# Patient Record
Sex: Female | Born: 1950 | Race: White | Hispanic: No | Marital: Married | Smoking: Current every day smoker
Health system: Southern US, Community
[De-identification: ages and names within clinical notes are randomized; demographics above are authoritative.]

## PROBLEM LIST (undated history)

## (undated) DIAGNOSIS — Z720 Tobacco use: Secondary | ICD-10-CM

## (undated) DIAGNOSIS — I998 Other disorder of circulatory system: Secondary | ICD-10-CM

## (undated) DIAGNOSIS — I739 Peripheral vascular disease, unspecified: Secondary | ICD-10-CM

## (undated) DIAGNOSIS — I70229 Atherosclerosis of native arteries of extremities with rest pain, unspecified extremity: Secondary | ICD-10-CM

## (undated) HISTORY — DX: Other disorder of circulatory system: I99.8

## (undated) HISTORY — DX: Tobacco use: Z72.0

## (undated) HISTORY — DX: Atherosclerosis of native arteries of extremities with rest pain, unspecified extremity: I70.229

---

## 2001-06-16 ENCOUNTER — Other Ambulatory Visit: Admission: RE | Admit: 2001-06-16 | Discharge: 2001-06-16 | Payer: Self-pay | Admitting: Obstetrics and Gynecology

## 2002-11-08 ENCOUNTER — Ambulatory Visit (HOSPITAL_COMMUNITY): Admission: RE | Admit: 2002-11-08 | Discharge: 2002-11-08 | Payer: Self-pay | Admitting: Obstetrics and Gynecology

## 2002-11-08 ENCOUNTER — Encounter: Payer: Self-pay | Admitting: Obstetrics and Gynecology

## 2003-10-14 ENCOUNTER — Ambulatory Visit (HOSPITAL_COMMUNITY): Admission: RE | Admit: 2003-10-14 | Discharge: 2003-10-14 | Payer: Self-pay | Admitting: Family Medicine

## 2014-09-23 ENCOUNTER — Encounter (HOSPITAL_COMMUNITY): Payer: Self-pay | Admitting: *Deleted

## 2014-09-23 ENCOUNTER — Emergency Department (HOSPITAL_COMMUNITY): Payer: Managed Care, Other (non HMO)

## 2014-09-23 ENCOUNTER — Emergency Department (HOSPITAL_COMMUNITY)
Admission: EM | Admit: 2014-09-23 | Discharge: 2014-09-23 | Disposition: A | Discharge status: Home / Self Care | Payer: Managed Care, Other (non HMO) | Attending: Emergency Medicine | Admitting: Emergency Medicine

## 2014-09-23 DIAGNOSIS — L089 Local infection of the skin and subcutaneous tissue, unspecified: Secondary | ICD-10-CM | POA: Diagnosis not present

## 2014-09-23 DIAGNOSIS — M79674 Pain in right toe(s): Secondary | ICD-10-CM | POA: Diagnosis present

## 2014-09-23 DIAGNOSIS — Z791 Long term (current) use of non-steroidal anti-inflammatories (NSAID): Secondary | ICD-10-CM | POA: Diagnosis not present

## 2014-09-23 DIAGNOSIS — L600 Ingrowing nail: Secondary | ICD-10-CM | POA: Diagnosis not present

## 2014-09-23 DIAGNOSIS — Z720 Tobacco use: Secondary | ICD-10-CM | POA: Diagnosis not present

## 2014-09-23 DIAGNOSIS — R609 Edema, unspecified: Secondary | ICD-10-CM

## 2014-09-23 DIAGNOSIS — Z792 Long term (current) use of antibiotics: Secondary | ICD-10-CM | POA: Insufficient documentation

## 2014-09-23 LAB — COMPREHENSIVE METABOLIC PANEL
ALT: 21 U/L (ref 0–35)
AST: 24 U/L (ref 0–37)
Albumin: 4 g/dL (ref 3.5–5.2)
Alkaline Phosphatase: 101 U/L (ref 39–117)
Anion gap: 7 (ref 5–15)
BILIRUBIN TOTAL: 0.4 mg/dL (ref 0.3–1.2)
BUN: 15 mg/dL (ref 6–23)
CHLORIDE: 106 meq/L (ref 96–112)
CO2: 27 mmol/L (ref 19–32)
CREATININE: 1.02 mg/dL (ref 0.50–1.10)
Calcium: 9.4 mg/dL (ref 8.4–10.5)
GFR calc Af Amer: 67 mL/min — ABNORMAL LOW (ref >90)
GFR calc non Af Amer: 58 mL/min — ABNORMAL LOW (ref >90)
Glucose, Bld: 120 mg/dL — ABNORMAL HIGH (ref 70–99)
Potassium: 4.1 mmol/L (ref 3.5–5.1)
Sodium: 140 mmol/L (ref 135–145)
Total Protein: 7.3 g/dL (ref 6.0–8.3)

## 2014-09-23 LAB — CBC WITH DIFFERENTIAL/PLATELET
BASOS ABS: 0.1 10*3/uL (ref 0.0–0.1)
Basophils Relative: 1 % (ref 0–1)
Eosinophils Absolute: 0.3 10*3/uL (ref 0.0–0.7)
Eosinophils Relative: 4 % (ref 0–5)
HEMATOCRIT: 40.3 % (ref 36.0–46.0)
Hemoglobin: 13.3 g/dL (ref 12.0–15.0)
LYMPHS ABS: 1.3 10*3/uL (ref 0.7–4.0)
LYMPHS PCT: 18 % (ref 12–46)
MCH: 32 pg (ref 26.0–34.0)
MCHC: 33 g/dL (ref 30.0–36.0)
MCV: 97.1 fL (ref 78.0–100.0)
MONO ABS: 0.4 10*3/uL (ref 0.1–1.0)
Monocytes Relative: 6 % (ref 3–12)
NEUTROS ABS: 5.1 10*3/uL (ref 1.7–7.7)
Neutrophils Relative %: 71 % (ref 43–77)
Platelets: 284 10*3/uL (ref 150–400)
RBC: 4.15 MIL/uL (ref 3.87–5.11)
RDW: 13.1 % (ref 11.5–15.5)
WBC: 7.2 10*3/uL (ref 4.0–10.5)

## 2014-09-23 MED ORDER — OXYCODONE-ACETAMINOPHEN 5-325 MG PO TABS: 1.0000 | ORAL_TABLET | Freq: Four times a day (QID) | ORAL | Status: DC | PRN

## 2014-09-23 MED ORDER — CLINDAMYCIN HCL 150 MG PO CAPS: ORAL_CAPSULE | ORAL | Status: DC

## 2014-09-23 MED ORDER — CLINDAMYCIN HCL 150 MG PO CAPS
300.0000 mg | ORAL_CAPSULE | Freq: Once | ORAL | Status: AC
  Administered 2014-09-23: 300 mg via ORAL
  Filled 2014-09-23: qty 2

## 2014-09-23 MED ORDER — OXYCODONE-ACETAMINOPHEN 5-325 MG PO TABS
1.0000 | ORAL_TABLET | Freq: Once | ORAL | Status: AC
  Administered 2014-09-23: 1 via ORAL
  Filled 2014-09-23: qty 1

## 2014-09-23 NOTE — ED Notes (Signed)
Pt had some skin she pulled off about a week and half ago to right great toe, now toe is red with black area to corner of nail

## 2014-09-23 NOTE — ED Provider Notes (Signed)
CSN: [id number]     Arrival date & time 09/23/14  1456 History   First MD Initiated Contact with Patient 09/23/14 1530     Chief Complaint  Patient presents with  . Toe Pain     (Consider location/radiation/quality/duration/timing/severity/associated sxs/prior Treatment) Patient is a 63 y.o. female presenting with toe pain. The history is provided by the patient (the pt states she has had toe pain for weeks.   she is on antibiotics and redness is improving but she still has pain).  Toe Pain This is a new problem. The current episode started more than 2 days ago. The problem occurs constantly. The problem has not changed since onset.Pertinent negatives include no chest pain, no abdominal pain and no headaches. Nothing aggravates the symptoms. Nothing relieves the symptoms.    History reviewed. No pertinent past medical history. History reviewed. No pertinent past surgical history. History reviewed. No pertinent family history. History  Substance Use Topics  . Smoking status: Current Every Day Smoker    Types: Cigarettes  . Smokeless tobacco: Not on file  . Alcohol Use: No   OB History    No data available     Review of Systems  Constitutional: Negative for appetite change and fatigue.  HENT: Negative for congestion, ear discharge and sinus pressure.   Eyes: Negative for discharge.  Respiratory: Negative for cough.   Cardiovascular: Negative for chest pain.  Gastrointestinal: Negative for abdominal pain and diarrhea.  Genitourinary: Negative for frequency and hematuria.  Musculoskeletal: Negative for back pain.       Toe pain  Skin: Negative for rash.  Neurological: Negative for seizures and headaches.  Psychiatric/Behavioral: Negative for hallucinations.      Allergies  Review of patient's allergies indicates no known allergies.  Home Medications   Prior to Admission medications   Medication Sig Start Date End Date Taking? Authorizing Provider  acetaminophen  (TYLENOL) 500 MG tablet Take 1,000 mg by mouth 2 (two) times daily as needed for moderate pain.   Yes Historical Provider, MD  nabumetone (RELAFEN) 750 MG tablet Take 750 mg by mouth 2 (two) times daily.   Yes Historical Provider, MD  sulfamethoxazole-trimethoprim (BACTRIM DS,SEPTRA DS) 800-160 MG per tablet Take 1 tablet by mouth 2 (two) times daily. Starting 09/19/2014 x 10 days.   Yes Historical Provider, MD  clindamycin (CLEOCIN) 150 MG capsule Take 2 tablets every 6 hours 09/23/14   Benny LennertJoseph L Genny Caulder, MD  oxyCODONE-acetaminophen (PERCOCET/ROXICET) 5-325 MG per tablet Take 1 tablet by mouth every 6 (six) hours as needed. 09/23/14   Benny LennertJoseph L Ava Deguire, MD   BP 137/61 mmHg  Pulse 88  Temp(Src) 97.6 F (36.4 C) (Oral)  Resp 16  SpO2 96% Physical Exam  Constitutional: She is oriented to person, place, and time. She appears well-developed.  HENT:  Head: Normocephalic.  Eyes: Conjunctivae are normal.  Neck: No tracheal deviation present.  Cardiovascular:  No murmur heard. Musculoskeletal: Normal range of motion.  Pt has an ingrown nail on the rt medial large toe.  She has a black necrotic area .5 cm diameter.  The toe is red and tender  Neurological: She is oriented to person, place, and time.  Skin: Skin is warm.  Psychiatric: She has a normal mood and affect.    ED Course  Procedures (including critical care time) Labs Review Labs Reviewed  COMPREHENSIVE METABOLIC PANEL - Abnormal; Notable for the following:    Glucose, Bld 120 (*)    GFR calc non Af Amer 58 (*)  GFR calc Af Amer 67 (*)    All other components within normal limits  CBC WITH DIFFERENTIAL    Imaging Review Dg Toe Great Right  09/23/2014   CLINICAL DATA:  RIGHT great toe pain, swollen, blackened area at medial aspect of nail bed  EXAM:  RIGHT GREAT TOE  COMPARISON:  None  FINDINGS: Osseous mineralization normal.  Joint spaces preserved.  No fracture, dislocation, or bone destruction.  IMPRESSION: Normal exam.    Electronically Signed   By: Ulyses SouthwardMark  Boles M.D.   On: 09/23/2014 15:49     EKG Interpretation None      MDM   Final diagnoses:  Swelling  Toe infection    Infected toe with small necrosis.   Infection improving with antibiotics.  Neg x-ray.  Will add clinda and have pt follow up with podiatrist in a few days    Benny LennertJoseph L Darrin Apodaca, MD 09/23/14 [phone]

## 2014-09-23 NOTE — Discharge Instructions (Signed)
Follow up with Dr. Emilio Mathavid Tucker  The foot md on Monday or Tuesday.    If you cannot see a foot md this week then follow up with Dr. Franky MachoMark Jenkins surgeon in [place]

## 2014-09-23 NOTE — ED Notes (Signed)
Pt seen at Urgent Care Monday and prescribed antibiotics

## 2014-09-26 ENCOUNTER — Ambulatory Visit (INDEPENDENT_AMBULATORY_CARE_PROVIDER_SITE_OTHER): Payer: Managed Care, Other (non HMO) | Admitting: Podiatry

## 2014-09-26 ENCOUNTER — Encounter: Payer: Self-pay | Admitting: Podiatry

## 2014-09-26 VITALS — BP 107/57 | HR 83 | Temp 98.4°F | Resp 16

## 2014-09-26 DIAGNOSIS — I739 Peripheral vascular disease, unspecified: Secondary | ICD-10-CM

## 2014-09-26 DIAGNOSIS — L03031 Cellulitis of right toe: Secondary | ICD-10-CM

## 2014-09-26 MED ORDER — OXYCODONE-ACETAMINOPHEN 10-325 MG PO TABS: 1.0000 | ORAL_TABLET | Freq: Three times a day (TID) | ORAL | Status: DC | PRN

## 2014-09-26 NOTE — Patient Instructions (Addendum)

## 2014-09-26 NOTE — Progress Notes (Signed)
Subjective:     Patient ID: Adrienne [facility], female   DOB: 12-18-1950, 63 y.o.   MRN: [id number]  HPI patient presents with a three-week history of a lot of pain in the right big toe and toenail medial side that she did try to work on herself. She has been to the emergency room where they feel she has an infection that placed her on antibiotics but it is not helping the extreme pain she is in and also the discoloration of the toe and the coolness of the toe itself   Review of Systems  All other systems reviewed and are negative.      Objective:   Physical Exam  Constitutional: She is oriented to person, place, and time.  Musculoskeletal: Normal range of motion.  Neurological: She is oriented to person, place, and time.  Skin: Skin is dry.  Nursing note and vitals reviewed.  neurovascular status is found to be diminished with diminished PT and DP pulses right over left and cold big toe right with red discoloration to the distal one half of the toe. On the medial side I noted there is some necrotic tissue distally and it is irritated but there was no drainage or odor noted. She is a smoker which is probably contributory to this condition she is experiencing and she is well oriented 3     Assessment:     Right big toe that appears to have some form of vascular embarrassment which may be systemic or local    Plan:     H&P and condition discussed with her and her husband. At this time I did infiltrate 60 mg Xylocaine Marcaine and I gently teased small amount of the tissue on the lateral side to try to reduce the discomfort but I did not take anything of consequence as I do think we are dealing with a vascular issue. I applied Band-Aids to the area with no tight compression and instructed on warm water soaks and I'm sending immediately for vascular evaluation to determine whether or not there is circulatory flow to this toe. I informed her there is a very good chance that she will require  amputation of the big toe right depending on findings and vascular and she is instructed to call us if any worsening of condition should occur. I did place her on Percocet 07/02/24 as the 01/31/24 are not helping with her pain

## 2014-09-26 NOTE — Progress Notes (Signed)
   Subjective:    Patient ID: Adrienne Swanson, female    DOB: Aug 03, 1951, 63 y.o.   MRN: [id number]  HPI Comments: "I have a bad toe"  Patient c/o throbbing 1st toe right, medial border, for about 3 weeks. The area is red, swollen, and extremely dark at the tip of the medial side of toe. She went to the ER and they gave antibiotics and said to soak. Initially, she said she thought was ingrown and trimmed but cut the skin and the nail. She did [facility] and said the bone looked fine.  Toe Pain       Review of Systems  Musculoskeletal: Positive for gait problem.  Hematological: Bruises/bleeds easily.  All other systems reviewed and are negative.      Objective:   Physical Exam        Assessment & Plan:

## 2014-09-27 ENCOUNTER — Ambulatory Visit (HOSPITAL_COMMUNITY)
Admission: RE | Admit: 2014-09-27 | Discharge: 2014-09-27 | Disposition: A | Discharge status: Home / Self Care | Payer: Managed Care, Other (non HMO) | Source: Ambulatory Visit | Attending: Cardiology | Admitting: Cardiology

## 2014-09-27 DIAGNOSIS — I739 Peripheral vascular disease, unspecified: Secondary | ICD-10-CM | POA: Insufficient documentation

## 2014-09-27 DIAGNOSIS — L03031 Cellulitis of right toe: Secondary | ICD-10-CM

## 2014-09-27 NOTE — Progress Notes (Signed)
Right Lower Extremity Arterial Duplex Completed. Evidence for a greater than 60% diameter reduction in the bilateral common iliac arteries. Brianna L Mazza,RVT

## 2014-09-29 ENCOUNTER — Encounter: Payer: Self-pay | Admitting: Podiatry

## 2014-09-29 ENCOUNTER — Telehealth: Payer: Self-pay | Admitting: Cardiovascular Disease

## 2014-09-29 NOTE — Telephone Encounter (Signed)
Closed encounter °

## 2014-09-29 NOTE — Telephone Encounter (Signed)
Nurse called in stating that Dr. Cristie HemNorman Regal is wanting to know the results of the pt's doppler that was done on 12/29. Please call  Thanks

## 2014-09-29 NOTE — Telephone Encounter (Signed)
Called spoke to receptionist-[facility].. RN informed receptionist the final report has been  completed,not scanned into the EPIC. Spoke to [place]ula[place]rianna , RVT-will fax results to [phone]

## 2014-10-03 ENCOUNTER — Telehealth: Payer: Self-pay | Admitting: *Deleted

## 2014-10-03 ENCOUNTER — Telehealth (HOSPITAL_COMMUNITY): Payer: Self-pay | Admitting: *Deleted

## 2014-10-03 NOTE — Telephone Encounter (Signed)
I wanted to call you about my pain meds.  Give me a call back, thank you.

## 2014-10-03 NOTE — Telephone Encounter (Signed)
Pt states she had the [place], but is not able to go back to work due to swelling and inability to get into a shoe.

## 2014-10-04 ENCOUNTER — Encounter: Payer: Self-pay | Admitting: *Deleted

## 2014-10-04 NOTE — Telephone Encounter (Signed)
"  I need you to call Adrienne Swanson.  Thank you."  I returned her call.  "Yes, they never received the note at [facility] for me to be out this week."  Who do I need to send it to?  "Just fax it and there number is [phone].  I'm still having pain in my toe.  It's a sharp pain.  I went to ER on Christmas day and they gave me another pain pill.  Then I came over the last Monday and he prescribed something else.   I was in pain all day yesterday.  I didn't know what to do.  I was scheduled to see [name] on tomorrow but they changed it to Thursday.  Is there anything else I can do?  Is it supposed to hurt like this still even though he has taken the nail out?"  Do you need more pain medicine?  "I still have pain medication.  I don't want to get addicted to it.  I can take it every 6 hours."  Hopefully [name] will be able to help you.  The pain may be due to your circulation problem.  [name] will be able to tell you what is going on and may be able to prescribe something.  "I hope so, I can't take this pain.  Will I see [name] again, do I need to schedule an appointment?"  I advised her no, come as needed.  "Please make sure Adrienne Swanson gets the letter."  I will make sure it is taken care of.  [facility].

## 2014-10-04 NOTE — Telephone Encounter (Signed)
"  I'm calling about my sister.  I'm on her permission to ask question.  She's still in a lot of pain.  I just want to see if there's anything else she can do.  She is in excruciating pain.  We just need some help for her.  Thank you.  I attempted to call the patient.  I left her a message to call me back.

## 2014-10-05 ENCOUNTER — Ambulatory Visit: Payer: Managed Care, Other (non HMO) | Admitting: Cardiovascular Disease

## 2014-10-06 ENCOUNTER — Encounter: Payer: Self-pay | Admitting: Cardiovascular Disease

## 2014-10-06 ENCOUNTER — Ambulatory Visit (INDEPENDENT_AMBULATORY_CARE_PROVIDER_SITE_OTHER): Payer: Managed Care, Other (non HMO) | Admitting: Cardiovascular Disease

## 2014-10-06 VITALS — BP 142/72 | HR 80 | Ht 65.0 in | Wt 142.9 lb

## 2014-10-06 DIAGNOSIS — I70229 Atherosclerosis of native arteries of extremities with rest pain, unspecified extremity: Secondary | ICD-10-CM | POA: Insufficient documentation

## 2014-10-06 DIAGNOSIS — R0989 Other specified symptoms and signs involving the circulatory and respiratory systems: Secondary | ICD-10-CM

## 2014-10-06 DIAGNOSIS — R5383 Other fatigue: Secondary | ICD-10-CM

## 2014-10-06 DIAGNOSIS — I998 Other disorder of circulatory system: Secondary | ICD-10-CM | POA: Insufficient documentation

## 2014-10-06 DIAGNOSIS — Z720 Tobacco use: Secondary | ICD-10-CM

## 2014-10-06 DIAGNOSIS — Z01812 Encounter for preprocedural laboratory examination: Secondary | ICD-10-CM

## 2014-10-06 DIAGNOSIS — I739 Peripheral vascular disease, unspecified: Secondary | ICD-10-CM

## 2014-10-06 DIAGNOSIS — Z79899 Other long term (current) drug therapy: Secondary | ICD-10-CM

## 2014-10-06 NOTE — Patient Instructions (Signed)
[name] has ordered a peripheral angiogram to be done at [facility].  This procedure is going to look at the bloodflow in your lower extremities.  If [name] is able to open up the arteries, you will have to spend one night in the hospital.  If he is not able to open the arteries, you will be able to go home that same day.    After the procedure, you will not be allowed to drive for 3 days or push, pull, or lift anything greater than 10 lbs for one week.    You will be required to have the following tests prior to the procedure:  1. Blood work-the blood work can be done no more than 7 days prior to the procedure.  It can be done at any [facility] lab.  There is one downstairs on the first floor of this building and one in the [facility] ([address])  2. Chest Xray-the chest xray order has already been placed at the [facility].     *REPS N/A

## 2014-10-06 NOTE — Progress Notes (Signed)
10/06/2014 Adrienne Swanson   Feb 01, 1951  [id number]  Primary Physician Colette RibasGOLDING, JOHN CABOT, MD Primary Cardiologist: Runell GessJonathan J. 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 She is accompanied by her husband today. She is referred by Dr. Charlsie Merlesegal , her podiatrist, for peripheral vascular evaluation because of a gangrenous right great toe and abnormal Doppler studies. Her primary care physician is Dr. Phillips OdorGolding in [place]. Her risk factors include 45 pack years of tobacco abuse currently trying to stop. Her father did have bypass surgery in his 4570s. There is no history of diabetes, hypertension or hyperlipidemia. She noted that painful right great toe early December. It became discolored later in the month. She saw her podiatrist who trimmed her toenails and some skin surrounding this. This subsequently turned gangrenous. Lower extremity arterial Doppler studies in our office performed 09/27/14 revealed a right ABI of 0.7 to come out left ABI 0.78 with bilateral iliac disease. She does complain of right hip thigh and calf claudication.   Current Outpatient Prescriptions  Medication Sig Dispense Refill  . acetaminophen (TYLENOL) 500 MG tablet Take 1,000 mg by mouth 2 (two) times daily as needed for moderate pain.    Marland Kitchen. oxyCODONE-acetaminophen (PERCOCET/ROXICET) 5-325 MG per tablet Take 1 tablet by mouth every 6 (six) hours as needed. 30 tablet 0   No current facility-administered medications for this visit.    No Known Allergies  History   Social History  . Marital Status: Married    Spouse Name: N/A    Number of Children: N/A  . Years of Education: N/A   Occupational History  . Not on file.   Social History Main Topics  . Smoking status: Current Every Day Smoker    Types: Cigarettes  . Smokeless tobacco: Not  on file  . Alcohol Use: No  . Drug Use: No  . Sexual Activity: Not on file   Other Topics Concern  . Not on file   Social History Narrative     Review of Systems: General: negative for chills, fever, night sweats or weight changes.  Cardiovascular: negative for chest pain, dyspnea on exertion, edema, orthopnea, palpitations, paroxysmal nocturnal dyspnea or shortness of breath Dermatological: negative for rash Respiratory: negative for cough or wheezing Urologic: negative for hematuria Abdominal: negative for nausea, vomiting, diarrhea, bright red blood per rectum, melena, or hematemesis Neurologic: negative for visual changes, syncope, or dizziness All other systems reviewed and are otherwise negative except as noted above.    Blood pressure 142/72, pulse 80, height 5\' 5"  (1.651 m), weight 142 lb 14.4 oz (64.819 kg).  General appearance: alert and no distress Neck: no adenopathy, no JVD, supple, symmetrical, trachea midline, thyroid not enlarged, symmetric, no tenderness/mass/nodules and soft right carotid bruit Lungs: clear to auscultation bilaterally Heart: regular rate and rhythm, S1, S2 normal, no murmur, click, rub or gallop Extremities: gangrenous appearing tip of her right great toe, palpable right posterior tibial, 1+ common femoral pulses without bruits  EKG not performed today  ASSESSMENT AND PLAN:   Critical lower limb ischemia The patient was referred by Dr. Orlene OchNorm Regal for evaluation of critical limb ischemia.she developed pain in her right great toe in early December. She developed lesion around Christmas. Dr. Charlsie Merlesegal trims them skin on her and since that time the toe has turned up blackish discoloration with severe  pain. She had Dopplers performed 09/27/14 revealed an ABI 0.72 on the right and 0.70 on the left. She did have a high-frequency signal at the origin of both iliac arteries right greater than left. She does complain of right hip thigh and calf claudication  especially when walking up an incline. Based on this tendon to arrange for undergo angiography and potential intervention of her right iliac artery in the setting of a gangrenous right great toe.      Runell Gess MD FACP,FACC,FAHA, [facility] 10/06/2014 2:34 PM

## 2014-10-06 NOTE — Assessment & Plan Note (Signed)
The patient was referred by Dr. Orlene OchNorm Regal for evaluation of critical limb ischemia.she developed pain in her right great toe in early December. She developed lesion around Christmas. Dr. Charlsie Merlesegal trims them skin on her and since that time the toe has turned up blackish discoloration with severe pain. She had Dopplers performed 09/27/14 revealed an ABI 0.72 on the right and 0.70 on the left. She did have a high-frequency signal at the origin of both iliac arteries right greater than left. She does complain of right hip thigh and calf claudication especially when walking up an incline. Based on this tendon to arrange for undergo angiography and potential intervention of her right iliac artery in the setting of a gangrenous right great toe.

## 2014-10-07 ENCOUNTER — Ambulatory Visit (HOSPITAL_COMMUNITY)
Admission: RE | Admit: 2014-10-07 | Discharge: 2014-10-07 | Disposition: A | Discharge status: Home / Self Care | Payer: Managed Care, Other (non HMO) | Source: Ambulatory Visit | Attending: Cardiovascular Disease | Admitting: Cardiovascular Disease

## 2014-10-07 ENCOUNTER — Telehealth: Payer: Self-pay | Admitting: Cardiovascular Disease

## 2014-10-07 DIAGNOSIS — Z01818 Encounter for other preprocedural examination: Secondary | ICD-10-CM | POA: Diagnosis present

## 2014-10-07 DIAGNOSIS — Z01812 Encounter for preprocedural laboratory examination: Secondary | ICD-10-CM

## 2014-10-07 DIAGNOSIS — Z720 Tobacco use: Secondary | ICD-10-CM

## 2014-10-07 LAB — APTT: APTT: 27 s (ref 24–37)

## 2014-10-07 LAB — COMPLETE METABOLIC PANEL WITH GFR
ALBUMIN: 3.9 g/dL (ref 3.5–5.2)
ALT: 115 U/L — ABNORMAL HIGH (ref 0–35)
AST: 41 U/L — AB (ref 0–37)
Alkaline Phosphatase: 168 U/L — ABNORMAL HIGH (ref 39–117)
BUN: 11 mg/dL (ref 6–23)
CALCIUM: 8.8 mg/dL (ref 8.4–10.5)
CO2: 26 mEq/L (ref 19–32)
Chloride: 105 mEq/L (ref 96–112)
Creat: 0.64 mg/dL (ref 0.50–1.10)
GFR, Est African American: >89 mL/min
GFR, Est Non African American: >89 mL/min
Glucose, Bld: 91 mg/dL (ref 70–99)
Potassium: 4.1 mEq/L (ref 3.5–5.3)
SODIUM: 143 meq/L (ref 135–145)
Total Bilirubin: 0.6 mg/dL (ref 0.2–1.2)
Total Protein: 6.9 g/dL (ref 6.0–8.3)

## 2014-10-07 LAB — CBC
HCT: 39.8 % (ref 36.0–46.0)
Hemoglobin: 13.4 g/dL (ref 12.0–15.0)
MCH: 31.1 pg (ref 26.0–34.0)
MCHC: 33.7 g/dL (ref 30.0–36.0)
MCV: 92.3 fL (ref 78.0–100.0)
MPV: 9.1 fL (ref 8.6–12.4)
Platelets: 338 10*3/uL (ref 150–400)
RBC: 4.31 MIL/uL (ref 3.87–5.11)
RDW: 14.2 % (ref 11.5–15.5)
WBC: 6.8 10*3/uL (ref 4.0–10.5)

## 2014-10-07 LAB — PROTIME-INR
INR: 0.99 (ref <1.50)
PROTHROMBIN TIME: 13.2 s (ref 11.6–15.2)

## 2014-10-07 LAB — TSH: TSH: 3.608 u[IU]/mL (ref 0.350–4.500)

## 2014-10-07 LAB — LIPID PANEL
CHOL/HDL RATIO: 6.3 ratio
CHOLESTEROL: 169 mg/dL (ref 0–200)
HDL: 27 mg/dL — ABNORMAL LOW (ref >39)
LDL CALC: 104 mg/dL — AB (ref 0–99)
Triglycerides: 190 mg/dL — ABNORMAL HIGH (ref <150)
VLDL: 38 mg/dL (ref 0–40)

## 2014-10-10 ENCOUNTER — Encounter (HOSPITAL_COMMUNITY)
Admission: RE | Disposition: A | Discharge status: Home / Self Care | Payer: Self-pay | Source: Ambulatory Visit | Attending: Cardiovascular Disease

## 2014-10-10 ENCOUNTER — Encounter (HOSPITAL_COMMUNITY): Payer: Self-pay | Admitting: General Practice

## 2014-10-10 ENCOUNTER — Ambulatory Visit (HOSPITAL_COMMUNITY)
Admission: RE | Admit: 2014-10-10 | Discharge: 2014-10-11 | Disposition: A | Discharge status: Home / Self Care | Payer: Managed Care, Other (non HMO) | Source: Ambulatory Visit | Attending: Cardiovascular Disease | Admitting: Cardiovascular Disease

## 2014-10-10 DIAGNOSIS — I739 Peripheral vascular disease, unspecified: Secondary | ICD-10-CM | POA: Diagnosis present

## 2014-10-10 DIAGNOSIS — Z79899 Other long term (current) drug therapy: Secondary | ICD-10-CM

## 2014-10-10 DIAGNOSIS — I70229 Atherosclerosis of native arteries of extremities with rest pain, unspecified extremity: Secondary | ICD-10-CM | POA: Diagnosis present

## 2014-10-10 DIAGNOSIS — F17210 Nicotine dependence, cigarettes, uncomplicated: Secondary | ICD-10-CM | POA: Insufficient documentation

## 2014-10-10 DIAGNOSIS — E785 Hyperlipidemia, unspecified: Secondary | ICD-10-CM | POA: Diagnosis present

## 2014-10-10 DIAGNOSIS — Z6823 Body mass index (BMI) 23.0-23.9, adult: Secondary | ICD-10-CM | POA: Insufficient documentation

## 2014-10-10 DIAGNOSIS — F17200 Nicotine dependence, unspecified, uncomplicated: Secondary | ICD-10-CM | POA: Diagnosis present

## 2014-10-10 DIAGNOSIS — Z01812 Encounter for preprocedural laboratory examination: Secondary | ICD-10-CM

## 2014-10-10 DIAGNOSIS — E663 Overweight: Secondary | ICD-10-CM | POA: Insufficient documentation

## 2014-10-10 DIAGNOSIS — I998 Other disorder of circulatory system: Secondary | ICD-10-CM | POA: Diagnosis present

## 2014-10-10 DIAGNOSIS — I70213 Atherosclerosis of native arteries of extremities with intermittent claudication, bilateral legs: Secondary | ICD-10-CM

## 2014-10-10 DIAGNOSIS — R0989 Other specified symptoms and signs involving the circulatory and respiratory systems: Secondary | ICD-10-CM

## 2014-10-10 DIAGNOSIS — R5383 Other fatigue: Secondary | ICD-10-CM

## 2014-10-10 DIAGNOSIS — I70263 Atherosclerosis of native arteries of extremities with gangrene, bilateral legs: Secondary | ICD-10-CM | POA: Diagnosis not present

## 2014-10-10 DIAGNOSIS — Z720 Tobacco use: Secondary | ICD-10-CM

## 2014-10-10 HISTORY — PX: LOWER EXTREMITY ANGIOGRAM: SHX5955

## 2014-10-10 HISTORY — DX: Peripheral vascular disease, unspecified: I73.9

## 2014-10-10 HISTORY — PX: LOWER EXTREMITY ANGIOGRAM: SHX5508

## 2014-10-10 LAB — POCT ACTIVATED CLOTTING TIME
ACTIVATED CLOTTING TIME: 208 s
Activated Clotting Time: 202 seconds
Activated Clotting Time: 171 seconds

## 2014-10-10 SURGERY — ANGIOGRAM, LOWER EXTREMITY
Anesthesia: LOCAL

## 2014-10-10 MED ORDER — MORPHINE SULFATE 2 MG/ML IJ SOLN: 2.0000 mg | INTRAMUSCULAR | Status: DC | PRN

## 2014-10-10 MED ORDER — ASPIRIN 81 MG PO CHEW
81.0000 mg | CHEWABLE_TABLET | ORAL | Status: AC
  Administered 2014-10-10: 81 mg via ORAL

## 2014-10-10 MED ORDER — LIDOCAINE HCL (PF) 1 % IJ SOLN
INTRAMUSCULAR | Status: AC
  Filled 2014-10-10: qty 30

## 2014-10-10 MED ORDER — ACETAMINOPHEN 500 MG PO TABS: 1000.0000 mg | ORAL_TABLET | Freq: Two times a day (BID) | ORAL | Status: DC | PRN

## 2014-10-10 MED ORDER — CLOPIDOGREL BISULFATE 75 MG PO TABS
300.0000 mg | ORAL_TABLET | Freq: Every day | ORAL | Status: DC
  Administered 2014-10-10: 300 mg via ORAL

## 2014-10-10 MED ORDER — SODIUM CHLORIDE 0.9 % IV SOLN: INTRAVENOUS | Status: AC

## 2014-10-10 MED ORDER — ACETAMINOPHEN 325 MG PO TABS: 650.0000 mg | ORAL_TABLET | ORAL | Status: DC | PRN

## 2014-10-10 MED ORDER — HEPARIN SODIUM (PORCINE) 1000 UNIT/ML IJ SOLN
INTRAMUSCULAR | Status: AC
  Filled 2014-10-10: qty 1

## 2014-10-10 MED ORDER — CLOPIDOGREL BISULFATE 75 MG PO TABS
75.0000 mg | ORAL_TABLET | Freq: Every day | ORAL | Status: DC
  Administered 2014-10-11: 75 mg via ORAL
  Filled 2014-10-10: qty 1

## 2014-10-10 MED ORDER — MIDAZOLAM HCL 2 MG/2ML IJ SOLN
INTRAMUSCULAR | Status: AC
  Filled 2014-10-10: qty 2

## 2014-10-10 MED ORDER — CLOPIDOGREL BISULFATE 300 MG PO TABS
ORAL_TABLET | ORAL | Status: AC
  Filled 2014-10-10: qty 1

## 2014-10-10 MED ORDER — ONDANSETRON HCL 4 MG/2ML IJ SOLN: 4.0000 mg | Freq: Four times a day (QID) | INTRAMUSCULAR | Status: DC | PRN

## 2014-10-10 MED ORDER — SODIUM CHLORIDE 0.9 % IJ SOLN: 3.0000 mL | INTRAMUSCULAR | Status: DC | PRN

## 2014-10-10 MED ORDER — ASPIRIN 81 MG PO CHEW
CHEWABLE_TABLET | ORAL | Status: AC
  Filled 2014-10-10: qty 4

## 2014-10-10 MED ORDER — SODIUM CHLORIDE 0.9 % IV SOLN
INTRAVENOUS | Status: DC
  Administered 2014-10-10: 08:00:00 via INTRAVENOUS

## 2014-10-10 MED ORDER — ASPIRIN 81 MG PO CHEW
324.0000 mg | CHEWABLE_TABLET | Freq: Once | ORAL | Status: AC
  Administered 2014-10-10: 324 mg via ORAL

## 2014-10-10 MED ORDER — FENTANYL CITRATE 0.05 MG/ML IJ SOLN
INTRAMUSCULAR | Status: AC
  Filled 2014-10-10: qty 2

## 2014-10-10 MED ORDER — HEPARIN (PORCINE) IN NACL 2-0.9 UNIT/ML-% IJ SOLN
INTRAMUSCULAR | Status: AC
  Filled 2014-10-10: qty 1000

## 2014-10-10 MED ORDER — ASPIRIN 81 MG PO CHEW
CHEWABLE_TABLET | ORAL | Status: AC
  Administered 2014-10-10: 81 mg via ORAL
  Filled 2014-10-10: qty 1

## 2014-10-10 MED ORDER — OXYCODONE-ACETAMINOPHEN 5-325 MG PO TABS: 1.0000 | ORAL_TABLET | Freq: Four times a day (QID) | ORAL | Status: DC | PRN

## 2014-10-10 NOTE — CV Procedure (Signed)
Adrienne Swanson is a 64 y.o. female    [id number] LOCATION:  FACILITY: [facility]  PHYSICIAN: Nanetta Batty, M.D. 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 She is accompanied by her husband today. She is referred by Dr. Charlsie Merles , her podiatrist, for peripheral vascular evaluation because of a gangrenous right great toe and abnormal Doppler studies. Her primary care physician is Dr. Phillips Odor in [place]. Her risk factors include 45 pack years of tobacco abuse currently trying to stop. Her father did have bypass surgery in his 90s. There is no history of diabetes, hypertension or hyperlipidemia. She noted that painful right great toe early December. It became discolored later in the month. She saw her podiatrist who trimmed her toenails and some skin surrounding this. This subsequently turned gangrenous. Lower extremity arterial Doppler studies in our office performed 09/27/14 revealed a right ABI of 0.7 to come out left ABI 0.78 with bilateral iliac disease. She does complain of right hip thigh and calf claudication.   PROCEDURE DESCRIPTION:   The patient was brought to the second floor [facility] lab in the postabsorptive state. She was premedicated with Valium 5 mg by mouth, IV Versed and fentanyl. Her right and left groinsWere prepped and shaved in usual sterile fashion. Xylocaine 1% was used for local anesthesia. A 5 French sheath was inserted into the right common femoral artery using standard Seldinger technique. 5 French pigtail catheter was placed in the distal abdominal aorta. Distal abdominal aorta aortography, bilateral iliac angiography with bifemoral runoff was performed using bolus chase digital subtraction  step table technique. Visipaque dye was used for the entirety of the case. Retrograde aortic pressure was monitored during the case.   HEMODYNAMICS:    AO SYSTOLIC/AO DIASTOLIC: 147/58, there was a 40 mm transstenotic gradient across the right common iliac artery ostium   Angiographic Data:   1: Abdominal aortogram-distal abdominal aorta was free of significant disease  2: Left lower extremity-there was a 60-70% ostial left common iliac artery stenosis. The left SFA was occluded at its origin and reconstituted by profunda femoris collaterals and Hunter's canal. There was three-vessel runoff  3: Right lower extremity-there was a 90% stenosis at the origin of the right common iliac artery with a 40 mm transstenotic gradient. 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 She also has right hip and lower extremity claudication. We will proceed with PTA and stenting of her iliac arteries using "kissing stent technique and  ICast covered stents.  Procedure Description:the 5 French sheath was exchanged over a 0.35 Versicore wire for a 7 French bright tip sheath. A 7 French bright tip sheath was inserted into the left common femoral artery using standard Seldinger technique. The patient received 7500 units of heparin intravenously with ending ACT of 202. Atenolol 170 mL of contrast was administered the patient. I placed 2 Versicores  across the right and left common iliac arteries. I then predilated the right common iliac artery ostium with a 5  mm x 4 centimeter balloon at nominal pressures. Following this I placed an 8 mm x 38 mm ICast  at the origin of the left common iliac and a 7 mm x 38 mm ICast  at the origin of the right common iliac  arteries. I then inflated simultaneously and postdilated with an 8mm x 4 cm of on the right and an 8mm x 2 cm balloon on the left resulting in reduction of a 90% right and 70% left ostial common iliac artery stenoses to 0% residual. The patient how the procedure well. I was able to switch out the right tip 7 French sheath for a River sheath on the right however I was unable to do this on the left and therefore had to hold manual pressure in the cath lab. The patient left the lab in stable condition. She'll be treated with dual antiplatelet therapy.  Final Impression: successful bilateral iliac artery PTA and stenting using ICast  Covered  stents for critical limb ischemia. The sheath on the right side will be removed once ACT falls below 170 and pressure held. She'll be gently hydrated overnight, discharged home in the morning on dual antiplatelet therapy. She'll need careful follow-up of her gangrenous right great toe. I will communicate this with her podiatrist Dr. Charlsie Merlesegal . We will get lower extremity arterial Doppler studies in our [place] office next week and will she will see mid-level provider back in 2-3 weeks on the date of I am in the office.    Runell GessBERRY,Alfard Cochrane J. MD, [facility] 10/10/2014 11:56 AM

## 2014-10-10 NOTE — Progress Notes (Signed)
Left groin site soft w/bruising. No hematoma. Tegaderm dressing applied.

## 2014-10-10 NOTE — H&P (View-Only) (Signed)
   10/06/2014 Adrienne Swanson   07/09/1951  [id number]  Primary Physician GOLDING, JOHN CABOT, MD Primary Cardiologist: Essance Gatti J. 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 She is accompanied by her husband today. She is referred by Dr. Regal , her podiatrist, for peripheral vascular evaluation because of a gangrenous right great toe and abnormal Doppler studies. Her primary care physician is Dr. Golding in [place]. Her risk factors include 45 pack years of tobacco abuse currently trying to stop. Her father did have bypass surgery in his 70s. There is no history of diabetes, hypertension or hyperlipidemia. She noted that painful right great toe early December. It became discolored later in the month. She saw her podiatrist who trimmed her toenails and some skin surrounding this. This subsequently turned gangrenous. Lower extremity arterial Doppler studies in our office performed 09/27/14 revealed a right ABI of 0.7 to come out left ABI 0.78 with bilateral iliac disease. She does complain of right hip thigh and calf claudication.   Current Outpatient Prescriptions  Medication Sig Dispense Refill  . acetaminophen (TYLENOL) 500 MG tablet Take 1,000 mg by mouth 2 (two) times daily as needed for moderate pain.    . oxyCODONE-acetaminophen (PERCOCET/ROXICET) 5-325 MG per tablet Take 1 tablet by mouth every 6 (six) hours as needed. 30 tablet 0   No current facility-administered medications for this visit.    No Known Allergies  History   Social History  . Marital Status: Married    Spouse Name: N/A    Number of Children: N/A  . Years of Education: N/A   Occupational History  . Not on file.   Social History Main Topics  . Smoking status: Current Every Day Smoker    Types: Cigarettes  . Smokeless tobacco: Not  on file  . Alcohol Use: No  . Drug Use: No  . Sexual Activity: Not on file   Other Topics Concern  . Not on file   Social History Narrative     Review of Systems: General: negative for chills, fever, night sweats or weight changes.  Cardiovascular: negative for chest pain, dyspnea on exertion, edema, orthopnea, palpitations, paroxysmal nocturnal dyspnea or shortness of breath Dermatological: negative for rash Respiratory: negative for cough or wheezing Urologic: negative for hematuria Abdominal: negative for nausea, vomiting, diarrhea, bright red blood per rectum, melena, or hematemesis Neurologic: negative for visual changes, syncope, or dizziness All other systems reviewed and are otherwise negative except as noted above.    Blood pressure 142/72, pulse 80, height 5' 5" (1.651 m), weight 142 lb 14.4 oz (64.819 kg).  General appearance: alert and no distress Neck: no adenopathy, no JVD, supple, symmetrical, trachea midline, thyroid not enlarged, symmetric, no tenderness/mass/nodules and soft right carotid bruit Lungs: clear to auscultation bilaterally Heart: regular rate and rhythm, S1, S2 normal, no murmur, click, rub or gallop Extremities: gangrenous appearing tip of her right great toe, palpable right posterior tibial, 1+ common femoral pulses without bruits  EKG not performed today  ASSESSMENT AND PLAN:   Critical lower limb ischemia The patient was referred by Dr. Norm Regal for evaluation of critical limb ischemia.she developed pain in her right great toe in early December. She developed lesion around Christmas. Dr. Regal trims them skin on her and since that time the toe has turned up blackish discoloration with severe   pain. She had Dopplers performed 09/27/14 revealed an ABI 0.72 on the right and 0.70 on the left. She did have a high-frequency signal at the origin of both iliac arteries right greater than left. She does complain of right hip thigh and calf claudication  especially when walking up an incline. Based on this tendon to arrange for undergo angiography and potential intervention of her right iliac artery in the setting of a gangrenous right great toe.      Odalis Jordan J. Ozella Comins MD FACP,FACC,FAHA, FSCAI 10/06/2014 2:34 PM  

## 2014-10-10 NOTE — Telephone Encounter (Signed)
Closed encounter °

## 2014-10-10 NOTE — Interval H&P Note (Signed)
History and Physical Interval Note:  10/10/2014 10:30 AM  Adrienne Swanson  has presented today for surgery, with the diagnosis of pvd  The various methods of treatment have been discussed with the patient and family. After consideration of risks, benefits and other options for treatment, the patient has consented to  Procedure(s): LOWER EXTREMITY ANGIOGRAM (N/A) as a surgical intervention .  The patient's history has been reviewed, patient examined, no change in status, stable for surgery.  I have reviewed the patient's chart and labs.  Questions were answered to the patient's satisfaction.     Runell GessBERRY,Aurore Redinger J

## 2014-10-10 NOTE — Progress Notes (Addendum)
Site area: rt groin Site Prior to Removal:  Level o Pressure Applied For 45 min Manual:   yes Patient Status During Pull:  sstable Post Pull Site:  Level 0 Post Pull Instructions Given:  Post instructions given to patient Post Pull Pulses Present: palpable bil dp Dressing Applied:  tergderm applied to rt groin Bedrest begins @ 1320 Comments: rt groin popped additional pressure held; total of 45 min hold. Site level 0

## 2014-10-11 ENCOUNTER — Telehealth: Payer: Self-pay | Admitting: Cardiovascular Disease

## 2014-10-11 ENCOUNTER — Telehealth (HOSPITAL_COMMUNITY): Payer: Self-pay | Admitting: *Deleted

## 2014-10-11 ENCOUNTER — Other Ambulatory Visit: Payer: Self-pay | Admitting: Cardiology

## 2014-10-11 DIAGNOSIS — I998 Other disorder of circulatory system: Secondary | ICD-10-CM

## 2014-10-11 DIAGNOSIS — Z720 Tobacco use: Secondary | ICD-10-CM

## 2014-10-11 DIAGNOSIS — I70229 Atherosclerosis of native arteries of extremities with rest pain, unspecified extremity: Secondary | ICD-10-CM

## 2014-10-11 DIAGNOSIS — E785 Hyperlipidemia, unspecified: Secondary | ICD-10-CM | POA: Diagnosis present

## 2014-10-11 DIAGNOSIS — I739 Peripheral vascular disease, unspecified: Secondary | ICD-10-CM | POA: Diagnosis present

## 2014-10-11 DIAGNOSIS — I70263 Atherosclerosis of native arteries of extremities with gangrene, bilateral legs: Secondary | ICD-10-CM | POA: Diagnosis not present

## 2014-10-11 DIAGNOSIS — F17200 Nicotine dependence, unspecified, uncomplicated: Secondary | ICD-10-CM | POA: Diagnosis present

## 2014-10-11 LAB — BASIC METABOLIC PANEL
Anion gap: 3 — ABNORMAL LOW (ref 5–15)
BUN: 13 mg/dL (ref 6–23)
CALCIUM: 8 mg/dL — AB (ref 8.4–10.5)
CO2: 23 mmol/L (ref 19–32)
CREATININE: 0.61 mg/dL (ref 0.50–1.10)
Chloride: 112 mEq/L (ref 96–112)
GFR calc Af Amer: >90 mL/min (ref >90)
GLUCOSE: 102 mg/dL — AB (ref 70–99)
Potassium: 3.9 mmol/L (ref 3.5–5.1)
SODIUM: 138 mmol/L (ref 135–145)

## 2014-10-11 LAB — CBC
HCT: 31.6 % — ABNORMAL LOW (ref 36.0–46.0)
Hemoglobin: 10.3 g/dL — ABNORMAL LOW (ref 12.0–15.0)
MCH: 31.2 pg (ref 26.0–34.0)
MCHC: 32.6 g/dL (ref 30.0–36.0)
MCV: 95.8 fL (ref 78.0–100.0)
Platelets: 320 10*3/uL (ref 150–400)
RBC: 3.3 MIL/uL — AB (ref 3.87–5.11)
RDW: 14.2 % (ref 11.5–15.5)
WBC: 7.6 10*3/uL (ref 4.0–10.5)

## 2014-10-11 MED ORDER — ASPIRIN 81 MG PO CHEW
81.0000 mg | CHEWABLE_TABLET | Freq: Every day | ORAL | Status: DC
  Administered 2014-10-11: 81 mg via ORAL
  Filled 2014-10-11: qty 1

## 2014-10-11 MED ORDER — ASPIRIN 81 MG PO CHEW: 81.0000 mg | CHEWABLE_TABLET | Freq: Every day | ORAL | Status: DC

## 2014-10-11 MED ORDER — ATORVASTATIN CALCIUM 40 MG PO TABS: 40.0000 mg | ORAL_TABLET | Freq: Every day | ORAL | Status: DC

## 2014-10-11 MED ORDER — ATORVASTATIN CALCIUM 40 MG PO TABS
40.0000 mg | ORAL_TABLET | Freq: Every day | ORAL | Status: DC
  Filled 2014-10-11: qty 1

## 2014-10-11 MED ORDER — CLOPIDOGREL BISULFATE 75 MG PO TABS: 75.0000 mg | ORAL_TABLET | Freq: Every day | ORAL | Status: DC

## 2014-10-11 NOTE — Discharge Instructions (Signed)
Arteriogram ° An arteriogram (or angiogram) is an X-ray test of your blood vessels. You will be awake during the test. This test looks for: °· Blocked blood vessels. °· Blood vessels that are not normal. °BEFORE THE TEST °Schedule an appointment for your arteriogram. Let the person know if: °· You have diabetes. °· You are allergic to any food or medicine. °· You are allergic to X-ray dye (contrast). °· You have asthma or had it when you were a child. °· You are pregnant or you might be pregnant. °· You are taking aspirin or blood thinners. °The night before the test:  °· Do not  eat or drink anything after midnight. °On the morning of your test:  °· Do not drive. Have someone bring you and take you home. °· Bring all the medicines you need to take with you. If you have diabetes, do not take your insulin before the test, but bring it with you. °THE TEST °· You will lie on the X-ray table. °· An IV tube is started in your arm. °· Your blood pressure and heartbeat are checked during the test. °· The upper part of your leg (groin) is shaved and washed with special soap. °· You are then covered with a germ free (sterile) sheet. Keep your arms at your side under the sheet at all times so that germs do not get on the sheet. °· The skin is numbed where the thin tube (catheter) is put into your upper leg. The thin tube is moved up into the blood vessels that your doctor wants to see. °· Dye is put in through the tube. You may have a feeling of heat as the dye moves into your body. °· X-ray pictures of your blood vessels are taken. Do not move while the dye goes in and pictures are taken. °AFTER THE TEST °· The thin tube will be taken out of your upper leg. °· The nurse will check: °· Your blood pressure. °· The place where the thin tube was taken out to make sure it is not bleeding. °· The blood flow to your feet. °· Lie flat in bed. Keep your leg straight for 6 hours after the thin tube is taken out. °· Ask your doctor or  nurse if you should take your regular medications that you brought. °Finding out the results of your test °Ask when your test results will be ready. Make sure you get your test results. °Document Released: 12/13/2008 Document Revised: 09/21/2013 Document Reviewed: 12/13/2008 °ExitCare® Patient Information ©2015 [employer]. This information is not intended to replace advice given to you by your health care provider. Make sure you discuss any questions you have with your health care provider. °Smoking Cessation °Quitting smoking is important to your health and has many advantages. However, it is not always easy to quit since nicotine is a very addictive drug. Oftentimes, people try 3 times or more before being able to quit. This document explains the best ways for you to prepare to quit smoking. Quitting takes hard work and a lot of effort, but you can do it. °ADVANTAGES OF QUITTING SMOKING °· You will live longer, feel better, and live better. °· Your body will feel the impact of quitting smoking almost immediately. °¨ Within 20 minutes, blood pressure decreases. Your pulse returns to its normal level. °¨ After 8 hours, carbon monoxide levels in the blood return to normal. Your oxygen level increases. °¨ After 24 hours, the chance of having a heart attack   starts to decrease. Your breath, hair, and body stop smelling like smoke. °¨ After 48 hours, damaged nerve endings begin to recover. Your sense of taste and smell improve. °¨ After 72 hours, the body is virtually free of nicotine. Your bronchial tubes relax and breathing becomes easier. °¨ After 2 to 12 weeks, lungs can hold more air. Exercise becomes easier and circulation improves. °· The risk of having a heart attack, stroke, cancer, or lung disease is greatly reduced. °¨ After 1 year, the risk of coronary heart disease is cut in half. °¨ After 5 years, the risk of stroke falls to the same as a nonsmoker. °¨ After 10 years, the risk of lung cancer is cut in  half and the risk of other cancers decreases significantly. °¨ After 15 years, the risk of coronary heart disease drops, usually to the level of a nonsmoker. °· If you are pregnant, quitting smoking will improve your chances of having a healthy baby. °· The people you live with, especially any children, will be healthier. °· You will have extra money to spend on things other than cigarettes. °QUESTIONS TO THINK ABOUT BEFORE ATTEMPTING TO QUIT °You may want to talk about your answers with your health care provider. °· Why do you want to quit? °· If you tried to quit in the past, what helped and what did not? °· What will be the most difficult situations for you after you quit? How will you plan to handle them? °· Who can help you through the tough times? Your family? Friends? A health care provider? °· What pleasures do you get from smoking? What ways can you still get pleasure if you quit? °Here are some questions to ask your health care provider: °· How can you help me to be successful at quitting? °· What medicine do you think would be best for me and how should I take it? °· What should I do if I need more help? °· What is smoking withdrawal like? How can I get information on withdrawal? °GET READY °· Set a quit date. °· Change your environment by getting rid of all cigarettes, ashtrays, matches, and lighters in your home, car, or work. Do not let people smoke in your home. °· Review your past attempts to quit. Think about what worked and what did not. °GET SUPPORT AND ENCOURAGEMENT °You have a better chance of being successful if you have help. You can get support in many ways. °· Tell your family, friends, and coworkers that you are going to quit and need their support. Ask them not to smoke around you. °· Get individual, group, or telephone counseling and support. Programs are available at local hospitals and health centers. Call your local health department for information about programs in your  area. °· Spiritual beliefs and practices may help some smokers quit. °· Download a "quit meter" on your computer to keep track of quit statistics, such as how long you have gone without smoking, cigarettes not smoked, and money saved. °· Get a self-help book about quitting smoking and staying off tobacco. °LEARN NEW SKILLS AND BEHAVIORS °· Distract yourself from urges to smoke. Talk to someone, go for a walk, or occupy your time with a task. °· Change your normal routine. Take a different route to work. Drink tea instead of coffee. Eat breakfast in a different place. °· Reduce your stress. Take a hot bath, exercise, or read a book. °· Plan something enjoyable to do every day. Reward yourself for   not smoking. °· Explore interactive web-based programs that specialize in helping you quit. °GET MEDICINE AND USE IT CORRECTLY °Medicines can help you stop smoking and decrease the urge to smoke. Combining medicine with the above behavioral methods and support can greatly increase your chances of successfully quitting smoking. °· Nicotine replacement therapy helps deliver nicotine to your body without the negative effects and risks of smoking. Nicotine replacement therapy includes nicotine gum, lozenges, inhalers, nasal sprays, and skin patches. Some may be available over-the-counter and others require a prescription. °· Antidepressant medicine helps people abstain from smoking, but how this works is unknown. This medicine is available by prescription. °· Nicotinic receptor partial agonist medicine simulates the effect of nicotine in your brain. This medicine is available by prescription. °Ask your health care provider for advice about which medicines to use and how to use them based on your health history. Your health care provider will tell you what side effects to look out for if you choose to be on a medicine or therapy. Carefully read the information on the package. Do not use any other product containing nicotine while  using a nicotine replacement product.  °RELAPSE OR DIFFICULT SITUATIONS °Most relapses occur within the first 3 months after quitting. Do not be discouraged if you start smoking again. Remember, most people try several times before finally quitting. You may have symptoms of withdrawal because your body is used to nicotine. You may crave cigarettes, be irritable, feel very hungry, cough often, get headaches, or have difficulty concentrating. The withdrawal symptoms are only temporary. They are strongest when you first quit, but they will go away within 10-14 days. °To reduce the chances of relapse, try to: °· Avoid drinking alcohol. Drinking lowers your chances of successfully quitting. °· Reduce the amount of caffeine you consume. Once you quit smoking, the amount of caffeine in your body increases and can give you symptoms, such as a rapid heartbeat, sweating, and anxiety. °· Avoid smokers because they can make you want to smoke. °· Do not let weight gain distract you. Many smokers will gain weight when they quit, usually less than 10 pounds. Eat a healthy diet and stay active. You can always lose the weight gained after you quit. °· Find ways to improve your mood other than smoking. °FOR MORE INFORMATION  °www.smokefree.gov  °Document Released: 09/10/2001 Document Revised: 01/31/2014 Document Reviewed: 12/26/2011 °ExitCare® Patient Information ©2015 [employer]. This information is not intended to replace advice given to you by your health care provider. Make sure you discuss any questions you have with your health care provider. ° °

## 2014-10-11 NOTE — Discharge Summary (Signed)
Patient ID: Adrienne Swanson,  MRN: [id number], DOB/AGE: [birth year] 64 y.o.  Admit date: 10/10/2014 Discharge date: 10/11/2014  Primary Care Provider: Colette Ribas, MD Primary Cardiologist: Dr Allyson Sabal  Discharge Diagnoses Principal Problem:   Critical lower limb ischemia Active Problems:   PVD with claudication- bilat iliac PTA   Dyslipidemia   Smoker    Procedures: PV angiogram and bilateral iliac PTA/ stenting 10/10/14   Hospital Course:  64 year old female from [place] referred to Dr Allyson Sabal by Dr. Charlsie Merles ,her podiatrist, for peripheral vascular evaluation because of a gangrenous right great toe and abnormal Doppler studies. Her risk factors include 45 pack years of tobacco abuse currently trying to stop. Her father did have bypass surgery in his 11s. There is no history of diabetes, hypertension or hyperlipidemia. She has been on no regular medications. She noted that painful right great toe early December. It became discolored later in the month. She saw her podiatrist who trimmed her toenails and some skin surrounding this. This subsequently turned gangrenous. Lower extremity arterial Doppler studies in our office performed 09/27/14 revealed a right ABI of 0.7 to come out left ABI 0.78 with bilateral iliac disease. She was admitted for PV angiogram which was done 10/10/14. This revealed bilateral iliac disease. She underwent successful bilateral iliac artery PTA and stenting using ICast Covered stents for critical limb ischemia. She is stable for discharge 10/11/14. She'll be set up for follow up dopplers as an OP.    Discharge Vitals:  Blood pressure 137/52, pulse 80, temperature 98.2 F (36.8 C), temperature source Oral, resp. rate 20, height  (1.651 m), weight 142 lb 13.7 oz (64.8 kg), SpO2 97 %.    Labs: Results for orders placed or performed during the hospital encounter of 10/10/14 (from the past 24 hour(s))  POCT Activated clotting time     Status: None   Collection Time: 10/10/14 11:04 AM  Result Value Ref Range   Activated Clotting Time 208 seconds  POCT Activated clotting time     Status: None   Collection Time: 10/10/14 11:37 AM  Result Value Ref Range   Activated Clotting Time 202 seconds  POCT Activated clotting time     Status: None   Collection Time: 10/10/14 12:30 PM  Result Value Ref Range   Activated Clotting Time 171 seconds  Basic metabolic panel      Status: Abnormal   Collection Time: 10/11/14  4:36 AM  Result Value Ref Range   Sodium 138 135 - 145 mmol/L   Potassium 3.9 3.5 - 5.1 mmol/L   Chloride 112 96 - 112 mEq/L   CO2 23 19 - 32 mmol/L   Glucose, Bld 102 (H) 70 - 99 mg/dL   BUN 13 6 - 23 mg/dL   Creatinine, Ser 4.09 0.50 - 1.10 mg/dL   Calcium 8.0 (L) 8.4 - 10.5 mg/dL   GFR calc non Af Amer >90 >90 mL/min   GFR calc Af Amer >90 >90 mL/min   Anion gap 3 (L) 5 - 15  CBC     Status: Abnormal   Collection Time: 10/11/14  4:36 AM  Result Value Ref Range   WBC 7.6 4.0 - 10.5 K/uL   RBC 3.30 (L) 3.87 - 5.11 MIL/uL   Hemoglobin 10.3 (L) 12.0 - 15.0 g/dL   HCT 81.1 (L) 91.4 - 78.2 %   MCV 95.8 78.0 - 100.0 fL   MCH 31.2 26.0 - 34.0 pg   MCHC 32.6 30.0 - 36.0  g/dL   RDW 16.114.2 09.611.5 - 04.515.5 %   Platelets 320 150 - 400 K/uL    Disposition:      Follow-up Information    Follow up with Runell GessBERRY,JONATHAN J, MD.   Specialty:  Cardiology   Why:  office will call you   Contact information:   [address] [place] [id number] [phone]       Discharge Medications:    Medication List    TAKE these medications        acetaminophen 500 MG tablet  Commonly known as:  TYLENOL  Take 1,000 mg by mouth 2 (two) times daily as needed for moderate pain.     aspirin 81 MG chewable tablet  Chew 1 tablet (81 mg total) by mouth daily.     atorvastatin 40 MG tablet  Commonly known as:  LIPITOR  Take 1 tablet (40 mg total) by mouth daily at 6 PM.     clopidogrel 75 MG tablet  Commonly known as:   PLAVIX  Take 1 tablet (75 mg total) by mouth daily.     oxyCODONE-acetaminophen 5-325 MG per tablet  Commonly known as:  PERCOCET/ROXICET  Take 1 tablet by mouth every 6 (six) hours as needed.         Duration of Discharge Encounter: Greater than 30 minutes including physician time.  Jolene ProvostSigned, Mohd Clemons PA-C 10/11/2014 7:02 AM

## 2014-10-11 NOTE — Telephone Encounter (Signed)
Closed encounter °

## 2014-10-11 NOTE — Progress Notes (Signed)
    Subjective:  No complaints  Objective:  Vital Signs in the last 24 hours: Temp:  [97.7 F (36.5 C)-98.8 F (37.1 C)] 98.2 F (36.8 C) (01/12 0616) Pulse Rate:  [66-83] 80 (01/12 0616) Resp:  [15-23] 20 (01/12 0616) BP: (103-140)/(52-82) 137/52 mmHg (01/12 0616) SpO2:  [94 %-100 %] 97 % (01/12 0616) Weight:  [142 lb (64.411 kg)-142 lb 13.7 oz (64.8 kg)] 142 lb 13.7 oz (64.8 kg) (01/12 0019)  Intake/Output from previous day:  Intake/Output Summary (Last 24 hours) at 10/11/14 0647 Last data filed at 10/11/14 [id number]  Gross per 24 hour  Intake    785 ml  Output   1150 ml  Net   -365 ml    Physical Exam: General appearance: alert, cooperative and no distress Lungs: clear to auscultation bilaterally Heart: regular rate and rhythm Extremities: some Rt groin echymosis, no hematoma   Rate: 78  Rhythm: normal sinus rhythm  Lab Results:  Recent Labs  10/11/14 0436  WBC 7.6  HGB 10.3*  PLT 320    Recent Labs  10/11/14 0436  NA 138  K 3.9  CL 112  CO2 23  GLUCOSE 102*  BUN 13  CREATININE 0.61   No results for input(s): TROPONINI in the last 72 hours.  Invalid input(s): CK, MB No results for input(s): INR in the last 72 hours.  Imaging: Imaging results have been reviewed   Assessment/Plan:   Principal Problem:   Critical lower limb ischemia- Rt great toe gangrene Active Problems:   PVD with claudication- bilat iliac PTA  10/11/13   Dyslipidemia   Smoker   PLAN: Add statin. F/U dopplers and OV to be arranged.  Corine ShelterLuke Kilroy PA-C Beeper [phone] 10/11/2014, 6:47 AM   Patient seen and examined and history reviewed. Agree with above findings and plan. Feels well this am. Still having some toe pain. VSS. Both groins without hematoma. Labs ok. Stable for DC today. Follow up as outlined. Recommend complete smoking cessation.  Rickey Sadowski [place], MDFACC 10/11/2014 7:13 AM

## 2014-10-13 ENCOUNTER — Telehealth: Payer: Self-pay | Admitting: Cardiovascular Disease

## 2014-10-13 NOTE — Telephone Encounter (Signed)
Please call,having an ultrasound next week. She wants to know if she need to have an appt with Dr Charlsie Merlesegal? If she does need to make one, will she need it before or after the ultasound.

## 2014-10-13 NOTE — Telephone Encounter (Signed)
I spoke with patient and relayed that she needs continual follow up with Dr Charlsie Merlesegal.  I don't think that she needs to see him ASAP, but I did encourage her to call and make a follow up appt with Dr Charlsie Merlesegal.  Adrienne Swanson verbalized understanding.

## 2014-10-19 ENCOUNTER — Ambulatory Visit (HOSPITAL_COMMUNITY)
Admission: RE | Admit: 2014-10-19 | Discharge: 2014-10-19 | Disposition: A | Discharge status: Home / Self Care | Payer: Managed Care, Other (non HMO) | Source: Ambulatory Visit | Attending: Cardiology | Admitting: Cardiology

## 2014-10-19 ENCOUNTER — Encounter (HOSPITAL_COMMUNITY): Payer: Managed Care, Other (non HMO)

## 2014-10-19 ENCOUNTER — Ambulatory Visit (HOSPITAL_BASED_OUTPATIENT_CLINIC_OR_DEPARTMENT_OTHER)
Admission: RE | Admit: 2014-10-19 | Discharge: 2014-10-19 | Disposition: A | Discharge status: Home / Self Care | Payer: Managed Care, Other (non HMO) | Source: Ambulatory Visit | Attending: Cardiology | Admitting: Cardiology

## 2014-10-19 DIAGNOSIS — R0989 Other specified symptoms and signs involving the circulatory and respiratory systems: Secondary | ICD-10-CM

## 2014-10-19 DIAGNOSIS — I998 Other disorder of circulatory system: Secondary | ICD-10-CM | POA: Insufficient documentation

## 2014-10-19 DIAGNOSIS — I70229 Atherosclerosis of native arteries of extremities with rest pain, unspecified extremity: Secondary | ICD-10-CM

## 2014-10-19 NOTE — Progress Notes (Signed)
Carotid Duplex Completed. °Brianna L Mazza,RVT °

## 2014-10-19 NOTE — Progress Notes (Signed)
Lower Extremity Arterial Duplex Completed. °Brianna L Mazza,RVT °

## 2014-10-24 ENCOUNTER — Other Ambulatory Visit (HOSPITAL_COMMUNITY): Payer: Self-pay | Admitting: Family Medicine

## 2014-10-24 DIAGNOSIS — Z1231 Encounter for screening mammogram for malignant neoplasm of breast: Secondary | ICD-10-CM

## 2014-10-31 ENCOUNTER — Ambulatory Visit (HOSPITAL_COMMUNITY): Payer: Managed Care, Other (non HMO)

## 2014-11-01 ENCOUNTER — Ambulatory Visit (INDEPENDENT_AMBULATORY_CARE_PROVIDER_SITE_OTHER): Payer: Managed Care, Other (non HMO) | Admitting: Cardiology

## 2014-11-01 ENCOUNTER — Telehealth: Payer: Self-pay | Admitting: *Deleted

## 2014-11-01 ENCOUNTER — Encounter: Payer: Self-pay | Admitting: Cardiology

## 2014-11-01 ENCOUNTER — Telehealth: Payer: Self-pay | Admitting: Cardiovascular Disease

## 2014-11-01 VITALS — BP 130/80 | HR 68 | Ht 65.0 in | Wt 140.9 lb

## 2014-11-01 DIAGNOSIS — I739 Peripheral vascular disease, unspecified: Secondary | ICD-10-CM

## 2014-11-01 NOTE — Telephone Encounter (Signed)
Order placed for repeat lower extremity arterial doppler in 6 months  

## 2014-11-01 NOTE — Patient Instructions (Signed)
Adrienne Simmons, PA-C, has ordered the following test(s) to be done: 1. Lower Extremity Arterial doppler to be done in 6 months - This test is an ultrasound of the arteries in the legs. It looks at arterial blood flow in the legs. Allow one hour for Lower Arterial scans. There are no restrictions or special instructions.  Adrienne Simmons, PA-C, wants you to follow-up in 6 months (1 week after dopplers) with Adrienne Swanson. You will receive a reminder letter in the mail two months in advance. If you don't receive a letter, please call our office to schedule the follow-up appointment.

## 2014-11-01 NOTE — Progress Notes (Signed)
Cardiology Office Note   Date:  11/01/2014   ID:  Adrienne HoylesDeborah M Swanson, DOB [birth year], MRN [id number]  PCP:  Trinna PostKOBERLEIN, JUNELL CAROL, MD  Cardiologist:  [name]  Reason for Visit/CC: Follow-up for PVD   History of Present Illness: Adrienne Swanson is a 64 y.o. female who presents for post procedural f/u after undergoing PV intervention for LE PVD/ critical limb ischemia. She has a h/o of chronic tobacco abuse. She was referred to [name] by Dr. Charlsie Merlesegal, podiatrist, after developing an ulcer on the medial base of her right great toe, which was felt to be gangrenous. She had lower extremity dopplers performed 09/27/14 which revealed an ABI of 0.72 on the right and 0.70 on the left. She did have a high-frequency signal at the origin of both iliac arteries (right greater than left). She also complained of right hip/thigh and calf claudication especially when walking up inclines. Peripheral angiography revealed a 90% stenosis at the origin of the right common iliac artery with a 40 mm transstenotic gradient. The remainder of the circulation on the right side was free of significant disease with three-vessel runoff. There was a 60-70% ostial left common iliac artery stenosis. The left SFA was occluded at its origin and reconstituted by profunda femoris collaterals and Hunter's canal. There was three-vessel runoff.  She underwent successful bilateral iliac artery PTA and stenting using ICast Covered stents. Repeat Dopplers demonstrated significant improvement with an ABI of 1.24 on the right and 0.78 on the left.  She presents back to clinic today for follow-up. She noted significant improvement with reduction of symptoms. She also notes significant provement in the appearance of her right great toe. She notes that it is less red and the size of the ulcer is smaller. Her foot was also examined by [name] who also notes significant improvement. Unfortunately, she has continued to smoke cigarettes although  she notes that she has decreased use significantly.     Past Medical History  Diagnosis Date  . Critical lower limb ischemia   . Tobacco abuse   . Peripheral vascular disease     Past Surgical History  Procedure Laterality Date  . Lower extremity angiogram Right 10/10/2014    dr berry  . Lower extremity angiogram N/A 10/10/2014    Procedure: LOWER EXTREMITY ANGIOGRAM;  Surgeon: Runell GessJonathan J Berry, MD;  [facility];  Service: Cardiovascular;  Laterality: N/A;     Current Outpatient Prescriptions  Medication Sig Dispense Refill  . acetaminophen (TYLENOL) 500 MG tablet Take 1,000 mg by mouth 2 (two) times daily as needed for moderate pain.    Marland Kitchen. aspirin 81 MG chewable tablet Chew 1 tablet (81 mg total) by mouth daily.    Marland Kitchen. atorvastatin (LIPITOR) 40 MG tablet Take 1 tablet (40 mg total) by mouth daily at 6 PM. 30 tablet 11  . clopidogrel (PLAVIX) 75 MG tablet Take 1 tablet (75 mg total) by mouth daily. 30 tablet 11  . oxyCODONE-acetaminophen (PERCOCET/ROXICET) 5-325 MG per tablet Take 1 tablet by mouth every 6 (six) hours as needed. 30 tablet 0   No current facility-administered medications for this visit.    Allergies:   Review of patient's allergies indicates no known allergies.    Social History:  The patient  reports that she has been smoking Cigarettes.  She has a 20 pack-year smoking history. She has never used smokeless tobacco. She reports that she does not drink alcohol or use illicit drugs.   Family History:  The patient's family history is notable for cancer in father  ROS:  Please see the history of present illness.   Otherwise, review of systems are positive for none.   All other systems are reviewed and negative.    PHYSICAL EXAM: VS:  BP 130/80 mmHg  Pulse 68  Ht  (1.651 m)  Wt 140 lb 14.4 oz (63.912 kg)  BMI 23.45 kg/m2 , BMI Body mass index is 23.45 kg/(m^2). GEN: Well nourished, well developed, in no acute distress HEENT: normal Neck: no JVD,  carotid bruits, or masses Cardiac:RRR; no murmurs, rubs, or gallops,no edema  Respiratory:  clear to auscultation bilaterally, normal work of breathing GI: soft, nontender, nondistended, + BS MS: no deformity or atrophy Skin: warm and dry, no rash Neuro:  Strength and sensation are intact Psych: euthymic mood, full affect   EKG:  EKG is not ordered today.    Recent Labs: 10/06/2014: ALT 115*; TSH 3.608 10/11/2014: BUN 13; Creatinine 0.61; Hemoglobin 10.3*; Platelets 320; Potassium 3.9; Sodium 138    Lipid Panel    Component Value Date/Time   CHOL 169 10/06/2014 1434   TRIG 190* 10/06/2014 1434   HDL 27* 10/06/2014 1434   CHOLHDL 6.3 10/06/2014 1434   VLDL 38 10/06/2014 1434   LDLCALC 104* 10/06/2014 1434      Wt Readings from Last 3 Encounters:  11/01/14 140 lb 14.4 oz (63.912 kg)  10/11/14 142 lb 13.7 oz (64.8 kg)  10/06/14 142 lb 14.4 oz (64.819 kg)      Other studies Reviewed: Additional studies/ records that were reviewed today include: PV report and Doppler studies Review of the above records demonstrates: See history of present illness   ASSESSMENT AND PLAN:  1.  Peripheral vascular disease: s/p successful bilateral iliac artery PTA and stenting using ICast Covered stents. Repeat Dopplers demonstrated significant improvement with an ABI of 1.24 on the right and 0.78 on the left. She notices significant reduction in symptoms. He ulcer on her right great toe also appears to be significantly improved. She continues to have 100% occlusion of the left SFA from her groin down. She has been fairly asymptomatic on the left side. This was discussed with Dr. Allyson Sabal. We will only consider intervention if she develops symptoms. For now, continue dual antiplatelet therapy with aspirin plus Plavix. Smoking cessation was strongly advised. Repeat Doppler studies in 6 months.    Current medicines are reviewed at length with the patient today.  The patient does not have concerns  regarding medicines.  The following changes have been made:  no change  Labs/ tests ordered today include:  Orders Placed This Encounter  Procedures  . Lower Extremity Arterial Doppler Bilateral     Disposition:   FU with Dr. Allyson Sabal in 6 months   Signed, Robbie Lis, PA-C  11/01/2014 11:31 AM    [facility] [address] [place], [place], [place]  [address] Phone: [phone]; Fax: [phone]

## 2014-11-01 NOTE — Telephone Encounter (Signed)
-----   Message from Runell GessJonathan J Berry, MD sent at 10/28/2014  7:07 AM EST ----- Improvement in bilat ABIs s/p intervention. Repeat 6 months

## 2014-11-01 NOTE — Telephone Encounter (Signed)
Patient brought in FMLA paperwork [facility] for Dr Allyson SabalBerry to complete and sign.  Patient completed ROI and check payment for [employer] to complete.  Sent FMLA paperwork, check and signed ROI to [employer] 11/01/14. lp

## 2014-11-08 ENCOUNTER — Ambulatory Visit: Payer: Managed Care, Other (non HMO) | Admitting: Cardiovascular Disease

## 2014-11-09 NOTE — Telephone Encounter (Signed)
2.10.16 Received FMLA paperwork from [employer].  Given to [place] for Dr Allyson SabalBerry to review and sign.  lp

## 2014-11-10 ENCOUNTER — Ambulatory Visit (HOSPITAL_COMMUNITY)
Admission: RE | Admit: 2014-11-10 | Discharge: 2014-11-10 | Disposition: A | Discharge status: Home / Self Care | Payer: Managed Care, Other (non HMO) | Source: Ambulatory Visit | Attending: Family Medicine | Admitting: Family Medicine

## 2014-11-10 DIAGNOSIS — Z1231 Encounter for screening mammogram for malignant neoplasm of breast: Secondary | ICD-10-CM | POA: Diagnosis present

## 2014-11-16 NOTE — Telephone Encounter (Signed)
Paperwork has been completed and signed by [name]. Placed on Adrienne Swanson's desk on 2/17 @ 6pm. Will f/u on 2/19 to ensure LP received.

## 2014-11-17 NOTE — Telephone Encounter (Signed)
Received completed and signed FMLA paperwork on my desk this am.  Notified patient they were completed. Faxed to [employer] and mailed patient her copy 11/17/14. lp

## 2014-12-09 ENCOUNTER — Telehealth: Payer: Self-pay

## 2014-12-09 NOTE — Telephone Encounter (Signed)
PCP office called back and said the best number to reach patient is [phone]

## 2014-12-09 NOTE — Telephone Encounter (Signed)
Adrienne Swanson from [facility] was calling to follow up on a triage referral. Patient is at their office and said that she never heard from us. I told Adrienne Swanson that DS had mailed patient a letter on 11/02/2014 and she has not replied back. Adrienne Swanson is going to call us back to verify if we have correct number (patient in with PCP).  I did not see another letter for the PCP saying that you have been unable to reach patient.

## 2014-12-14 NOTE — Telephone Encounter (Signed)
Pt is very busy at work. Ok to call back tomorrow afternoon between 1:30-4:30 .

## 2014-12-20 NOTE — Telephone Encounter (Signed)
Pt is busy at work. She will try to call me on her day off.

## 2014-12-27 NOTE — Telephone Encounter (Signed)
Letter mailed to pt and faxed letter to PCP.

## 2015-02-12 IMAGING — DX DG CHEST 2V
2 series · 2 of 2 positions shown · non-contrast
Comparison: None.

CLINICAL DATA: Preop exam for lower extremity angiography.

EXAM:
CHEST  2 VIEW

[chest pa]
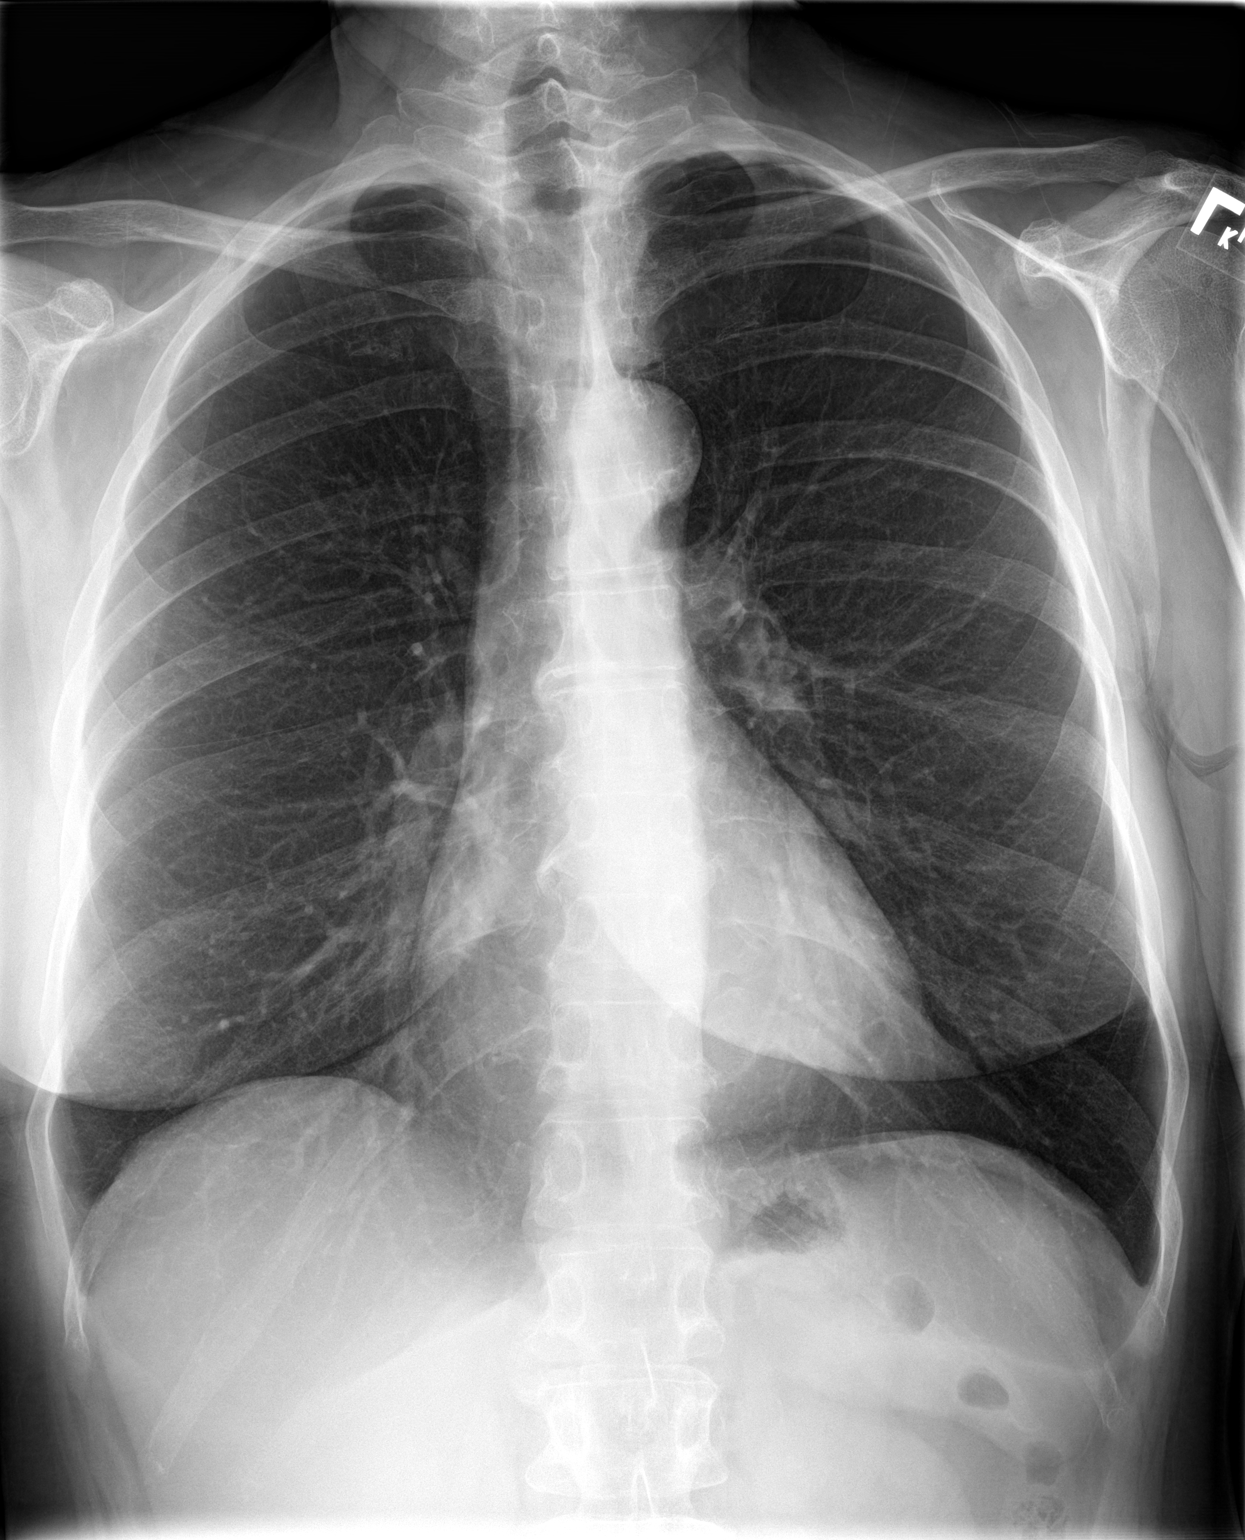

[chest lat]
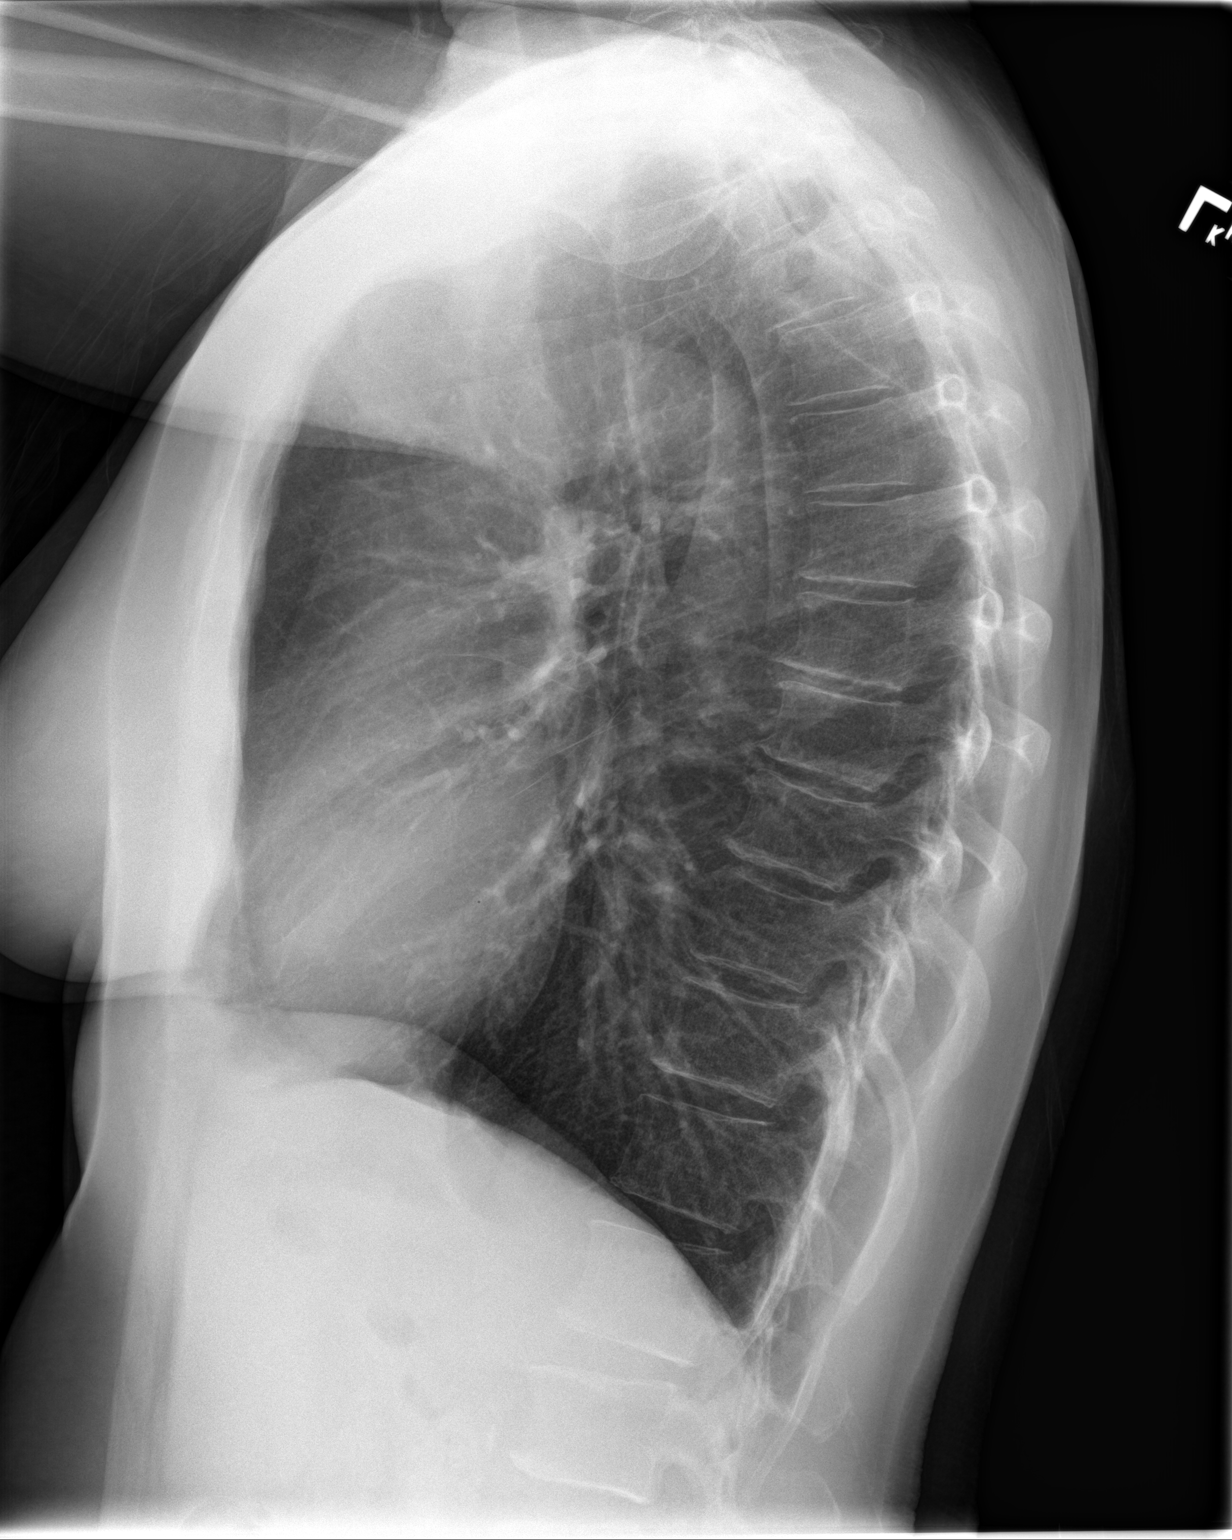

[2 of 2 positions shown; findings below may reference images not displayed]

FINDINGS: The heart size and mediastinal contours are within normal limits.
Both lungs are clear. The visualized skeletal structures are
unremarkable.
IMPRESSION: No active cardiopulmonary disease.

## 2015-04-14 ENCOUNTER — Telehealth (HOSPITAL_COMMUNITY): Payer: Self-pay | Admitting: *Deleted

## 2015-09-06 ENCOUNTER — Other Ambulatory Visit: Payer: Self-pay | Admitting: Cardiology

## 2015-09-07 NOTE — Telephone Encounter (Signed)
REFILL 

## 2023-08-31 DIAGNOSIS — M5432 Sciatica, left side: Secondary | ICD-10-CM | POA: Diagnosis not present

## 2024-10-12 ENCOUNTER — Ambulatory Visit: Admitting: Physician Assistant

## 2024-10-12 ENCOUNTER — Encounter: Payer: Self-pay | Admitting: Physician Assistant

## 2024-10-12 VITALS — BP 128/82 | HR 101 | Temp 98.4°F | Ht 65.0 in | Wt 136.4 lb

## 2024-10-12 DIAGNOSIS — F17200 Nicotine dependence, unspecified, uncomplicated: Secondary | ICD-10-CM

## 2024-10-12 DIAGNOSIS — E785 Hyperlipidemia, unspecified: Secondary | ICD-10-CM | POA: Diagnosis not present

## 2024-10-12 NOTE — Progress Notes (Signed)
 "  New Patient Office Visit  Subjective    Patient ID: Adrienne Swanson, female    DOB: Mar 08, 1951  Age: 74 y.o. MRN: [id number]  CC:  Chief Complaint  Patient presents with   Establish Care    PT needs new prescription on Gen[place]ia ans Gabapentin    HPI Adrienne Swanson presents to establish care  Discussed the use of AI scribe software for clinical note transcription with the patient, who gave verbal consent to proceed.  History of Present Illness Adrienne Swanson is a 74 year old female who presents to establish care without major health concerns.   She has a history of high cholesterol but is not currently taking medication. She is unsure of her last lipid blood work. Previous PCP records unavailable at this time.   She has smoked since age 85. She currently smokes about half a pack per day, with use varying with stress. She is working on cutting down.   She had a sciatic nerve problem in December 2024 that was treated at urgent care. No current symptoms or back pain.   She denies chest pain, shortness of breath, or leg swelling. She received a flu shot this summer.   Outpatient Encounter Medications as of 10/12/2024  Medication Sig   [DISCONTINUED] acetaminophen  (TYLENOL ) 500 MG tablet Take 1,000 mg by mouth 2 (two) times daily as needed for moderate pain. (Patient not taking: Reported on 10/12/2024)   [DISCONTINUED] aspirin  81 MG chewable tablet Chew 1 tablet (81 mg total) by mouth daily. (Patient not taking: Reported on 10/12/2024)   [DISCONTINUED] atorvastatin  (LIPITOR) 40 MG tablet TAKE 1 TABLET ONCE DAILY AT 6 P.M. (Patient not taking: Reported on 10/12/2024)   [DISCONTINUED] clopidogrel  (PLAVIX ) 75 MG tablet TAKE ONE TABLET BY MOUTH ONCE DAILY. (Patient not taking: Reported on 10/12/2024)   [DISCONTINUED] oxyCODONE -acetaminophen  (PERCOCET/ROXICET) 5-325 MG per tablet Take 1 tablet by mouth every 6 (six) hours as needed. (Patient not taking: Reported on 10/12/2024)   No  facility-administered encounter medications on file as of 10/12/2024.    Past Medical History:  Diagnosis Date   Critical lower limb ischemia (HCC)    Peripheral vascular disease    Tobacco abuse     Past Surgical History:  Procedure Laterality Date   LOWER EXTREMITY ANGIOGRAM Right 10/10/2014   dr berry   LOWER EXTREMITY ANGIOGRAM N/A 10/10/2014   Procedure: LOWER EXTREMITY ANGIOGRAM;  Surgeon: Dorn JINNY Lesches, MD;  Location: [facility] CATH LAB;  Service: Cardiovascular;  Laterality: N/A;    No family history on file.  Social History   Socioeconomic History   Marital status: Married    Spouse name: Not on file   Number of children: Not on file   Years of education: Not on file   Highest education level: Not on file  Occupational History   Not on file  Tobacco Use   Smoking status: Every Day    Current packs/day: 1.00    Average packs/day: 1 pack/day for 54.0 years (53.5 ttl pk-yrs)    Types: Cigarettes   Smokeless tobacco: Never  Substance and Sexual Activity   Alcohol use: No   Drug use: No   Sexual activity: Not on file  Other Topics Concern   Not on file  Social History Narrative   Not on file   Social Drivers of Health   Tobacco Use: High Risk (10/12/2024)   Patient History    Smoking Tobacco Use: Every Day    Smokeless Tobacco Use: Never  Passive Exposure: Not on file  Financial Resource Strain: Not on file  Food Insecurity: Not on file  Transportation Needs: Not on file  Physical Activity: Not on file  Stress: Not on file  Social Connections: Not on file  Intimate Partner Violence: Not on file  Depression (PHQ2-9): Low Risk (10/12/2024)   Depression (PHQ2-9)    PHQ-2 Score: 0  Alcohol Screen: Not on file  Housing: Not on file  Utilities: Not on file  Health Literacy: Not on file    Review of Systems  Constitutional:  Negative for activity change, appetite change, fatigue and fever.  Eyes:  Negative for visual disturbance.  Respiratory:  Negative  for cough and shortness of breath.   Cardiovascular:  Negative for chest pain.  Musculoskeletal:  Negative for back pain, gait problem and myalgias.  Neurological:  Negative for light-headedness and headaches.         Objective    BP 128/82 (Patient Position: Standing)   Pulse (!) 101   Temp 98.4 F (36.9 C)   Ht 5' 5 (1.651 m)   Wt 136 lb 6.4 oz (61.9 kg)   SpO2 95%   BMI 22.70 kg/m   Physical Exam Constitutional:      General: She is not in acute distress.    Appearance: Normal appearance. She is normal weight. She is not ill-appearing.  HENT:     Head: Normocephalic and atraumatic.     Mouth/Throat:     Mouth: Mucous membranes are moist.     Pharynx: Oropharynx is clear.  Eyes:     Extraocular Movements: Extraocular movements intact.     Conjunctiva/sclera: Conjunctivae normal.  Cardiovascular:     Rate and Rhythm: Normal rate and regular rhythm.     Heart sounds: Normal heart sounds. No murmur heard. Pulmonary:     Effort: Pulmonary effort is normal.     Breath sounds: Normal breath sounds. No wheezing, rhonchi or rales.  Musculoskeletal:     Right lower leg: No edema.     Left lower leg: No edema.  Skin:    General: Skin is warm and dry.  Neurological:     General: No focal deficit present.     Mental Status: She is alert and oriented to person, place, and time.  Psychiatric:        Mood and Affect: Mood normal.        Behavior: Behavior normal.       Assessment & Plan:  Dyslipidemia Assessment & Plan: History of elevated cholesterol, not currently on medication. Lipid panel today.  Discussed healthy diet and lifestyle.   Orders: -     Lipid panel  Smoker Assessment & Plan: Long-standing nicotine dependence, currently smoking half a pack per day. Discussed lung cancer screening with low dose CT scan. Denies cough or shortness of breath.  - Patient undecided on lung cancer screening at this time.  - Continue to work on smoking  cessation.      Return in about 6 months (around 04/11/2025).   Hanna Ra, PA-C  "

## 2024-10-12 NOTE — Assessment & Plan Note (Signed)
 History of elevated cholesterol, not currently on medication. Lipid panel today.  Discussed healthy diet and lifestyle.

## 2024-10-12 NOTE — Assessment & Plan Note (Signed)
 Long-standing nicotine dependence, currently smoking half a pack per day. Discussed lung cancer screening with low dose CT scan. Denies cough or shortness of breath.  - Patient undecided on lung cancer screening at this time.  - Continue to work on smoking cessation.

## 2024-10-13 ENCOUNTER — Ambulatory Visit: Payer: Self-pay | Admitting: Physician Assistant

## 2024-10-13 LAB — LIPID PANEL
Chol/HDL Ratio: 4.7 ratio — ABNORMAL HIGH (ref 0.0–4.4)
Cholesterol, Total: 200 mg/dL — ABNORMAL HIGH (ref 100–199)
HDL: 43 mg/dL
LDL Chol Calc (NIH): 137 mg/dL — ABNORMAL HIGH (ref 0–99)
Triglycerides: 111 mg/dL (ref 0–149)
VLDL Cholesterol Cal: 20 mg/dL (ref 5–40)

## 2025-04-11 ENCOUNTER — Ambulatory Visit: Admitting: Physician Assistant
